# Patient Record
Sex: Male | Born: 1940 | Race: White | Hispanic: No | Marital: Single
Health system: Southern US, Community
[De-identification: ages and names within clinical notes are randomized; demographics above are authoritative.]

## PROBLEM LIST (undated history)

## (undated) DIAGNOSIS — I2510 Atherosclerotic heart disease of native coronary artery without angina pectoris: Secondary | ICD-10-CM

## (undated) DIAGNOSIS — E7800 Pure hypercholesterolemia, unspecified: Secondary | ICD-10-CM

## (undated) DIAGNOSIS — E785 Hyperlipidemia, unspecified: Secondary | ICD-10-CM

## (undated) DIAGNOSIS — I10 Essential (primary) hypertension: Secondary | ICD-10-CM

## (undated) DIAGNOSIS — N320 Bladder-neck obstruction: Secondary | ICD-10-CM

## (undated) DIAGNOSIS — N429 Disorder of prostate, unspecified: Secondary | ICD-10-CM

## (undated) DIAGNOSIS — N200 Calculus of kidney: Secondary | ICD-10-CM

## (undated) DIAGNOSIS — Z87898 Personal history of other specified conditions: Secondary | ICD-10-CM

## (undated) HISTORY — PX: OTHER SURGICAL HISTORY: SHX169

## (undated) HISTORY — DX: Pure hypercholesterolemia, unspecified: E78.00

## (undated) HISTORY — DX: Essential (primary) hypertension: I10

## (undated) HISTORY — DX: Calculus of kidney: N20.0

## (undated) HISTORY — DX: Hyperlipidemia, unspecified: E78.5

## (undated) HISTORY — DX: Atherosclerotic heart disease of native coronary artery without angina pectoris: I25.10

## (undated) HISTORY — DX: Personal history of other specified conditions: Z87.898

## (undated) HISTORY — DX: Bladder-neck obstruction: N32.0

---

## 1983-10-26 HISTORY — PX: HERNIA REPAIR: SHX51

## 1999-12-08 ENCOUNTER — Encounter: Payer: Self-pay | Admitting: Thoracic Surgery (Cardiothoracic Vascular Surgery)

## 1999-12-09 ENCOUNTER — Encounter: Payer: Self-pay | Admitting: Thoracic Surgery (Cardiothoracic Vascular Surgery)

## 1999-12-09 ENCOUNTER — Inpatient Hospital Stay (HOSPITAL_COMMUNITY)
Admission: RE | Admit: 1999-12-09 | Discharge: 1999-12-14 | Payer: Self-pay | Admitting: Thoracic Surgery (Cardiothoracic Vascular Surgery)

## 1999-12-10 ENCOUNTER — Encounter: Payer: Self-pay | Admitting: Thoracic Surgery (Cardiothoracic Vascular Surgery)

## 1999-12-11 ENCOUNTER — Encounter: Payer: Self-pay | Admitting: Thoracic Surgery (Cardiothoracic Vascular Surgery)

## 2000-09-24 HISTORY — PX: CORONARY ARTERY BYPASS GRAFT: SHX141

## 2005-10-12 ENCOUNTER — Emergency Department: Payer: Self-pay | Admitting: Emergency Medicine

## 2006-03-21 ENCOUNTER — Emergency Department: Payer: Self-pay | Admitting: Emergency Medicine

## 2006-04-03 ENCOUNTER — Other Ambulatory Visit: Payer: Self-pay

## 2006-04-03 ENCOUNTER — Emergency Department: Payer: Self-pay | Admitting: Emergency Medicine

## 2006-04-28 ENCOUNTER — Ambulatory Visit: Payer: Self-pay

## 2006-04-29 ENCOUNTER — Ambulatory Visit: Payer: Self-pay

## 2006-05-17 ENCOUNTER — Ambulatory Visit: Payer: Self-pay | Admitting: Urology

## 2007-05-18 ENCOUNTER — Ambulatory Visit: Payer: Self-pay | Admitting: Urology

## 2009-03-17 ENCOUNTER — Emergency Department (HOSPITAL_COMMUNITY): Admission: EM | Admit: 2009-03-17 | Discharge: 2009-03-17 | Payer: Self-pay | Admitting: Emergency Medicine

## 2009-03-21 ENCOUNTER — Emergency Department (HOSPITAL_COMMUNITY): Admission: EM | Admit: 2009-03-21 | Discharge: 2009-03-21 | Payer: Self-pay | Admitting: Family Medicine

## 2010-01-29 ENCOUNTER — Emergency Department: Payer: Self-pay | Admitting: Emergency Medicine

## 2010-01-30 ENCOUNTER — Emergency Department: Payer: Self-pay | Admitting: Emergency Medicine

## 2010-02-03 ENCOUNTER — Inpatient Hospital Stay: Payer: Self-pay | Admitting: Urology

## 2010-08-04 ENCOUNTER — Inpatient Hospital Stay: Payer: Self-pay | Admitting: Internal Medicine

## 2011-02-02 LAB — RAPID URINE DRUG SCREEN, HOSP PERFORMED
Amphetamines: NOT DETECTED
Barbiturates: NOT DETECTED
Benzodiazepines: NOT DETECTED
Cocaine: NOT DETECTED
Opiates: NOT DETECTED
Tetrahydrocannabinol: NOT DETECTED

## 2011-02-02 LAB — POCT CARDIAC MARKERS
CKMB, poc: 2.7 ng/mL (ref 1.0–8.0)
Myoglobin, poc: 151 ng/mL (ref 12–200)

## 2011-03-12 NOTE — Discharge Summary (Signed)
[place]. [facility]  Patient:    Jeff Reynolds, Jeff Reynolds                        MRN: [id number] Adm. Date:  [id number] Disc. Date: [id number] Attending:  Tressie Stalker Dictator:   Lissa Merlin, P.A.                           Discharge Summary  DATE OF BIRTH:  July 03, 1941.  During morning rounds today, Jeff Reynolds was evaluated for anticipated discharge home.  He looked very well.  He said that he felt well except for some hesitancy and frequency of urination this morning.  He does have a history of BPH.  His vital signs are stable:  blood pressure 128/70, pulse 83, sinus rhythm on telemetry.  Respiratory rate 18, 96% on room air, temperature 98.1.  This physical exam was  satisfactory.  His chest was clear.  His abdomen was soft, nontender, with positive bowel sounds.  His heart was regular rate and rhythm, S1, S2.  His wounds were healing well.  He had 1+ edema in his lower extremities.  His suprapubic area was nontender and nondistended.  At this time it was felt that he should be reevaluated later in the morning for possible discharge and monitor his urinary output and check a UA and culture.  Later in the day, his lab results came back for his urine, which indicated a mild UTI.  Patient relates recent history of UTI before hospitalization for which he was treated with Cipro.  He also relates history of taking Flomax for his BPH, but did not indicate this on his admission medications. A post void residual urine in-and-out cath was done which revealed 275 cc of clear yellow urine.  After arranging follow-up with Dr. Achilles Dunk, his urologist in [place] for 9:45 a.m. in the morning, Jeff Reynolds was deemed suitable for discharge home. He said he felt much better after the in-and-out cath.  He understands to follow up with Dr. Achilles Dunk in the morning.  He was discharged home on Cipro 500 mg p.o. b.i.d. and Flomax 0.4 mg p.o. q.d. in addition to the medications listed  on the earlier report. DD:  12/14/99 TD:  12/14/99 Job: [id number]

## 2011-03-12 NOTE — Op Note (Signed)
[place]. [facility])  Patient:    Jeff Reynolds, Jeff Reynolds                        MRN: [id number] Proc. Date: 12/09/99 Adm. Date:  [id number] Attending:  Tressie Stalker CC:         Arnoldo Hooker, M.D., [place], [place]             Jeff Mansfield, M.D., [place], [place]             CVTS Office                           Operative Report  PREOPERATIVE DIAGNOSIS:  Severe three-vessel coronary artery disease with positive stress test and mild left ventricular dysfunction.  POSTOPERATIVE DIAGNOSIS:  Severe three-vessel coronary artery disease with positive stress test and mild left ventricular dysfunction.  OPERATION:  Mediastinotomy for coronary artery bypass grafting x 4 (left internal mammary artery to distal left anterior descending coronary artery, right internal mammary artery to distal right coronary artery, saphenous vein graft to first diagonal branch, saphenous vein graft to ramus intermedius branch.  SURGEON:  Salvatore Decent. Cornelius Moras, M.D.  ASSISTANT:   Maple Mirza, P.A.  ANESTHESIA:   General.  BRIEF CLINICAL NOTE:  The patient is a 70 year old white male from [place], [place] followed by Dr. Dale Reynolds and referred by Dr. Arnoldo Hooker for  management of coronary artery disease.  Jeff Reynolds has history of hypertension, hypercholesterolemia.  He recently developed a near syncopal episode following change of medications for treatment of hypertension.  This prompted a stress test to be performed, and a stress echocardiogram was performed by Dr. Gwen Pounds. This revealed changes consistent with coronary ischemia.  Elective cardiac catheterization was performed by Dr. Gwen Pounds which revealed severe three-vessel coronary artery disease.  There was mild mitral regurgitation and mild to moderate left ventricular dysfunction with overall ejection fraction estimated at 45%. he patient was referred for surgical  intervention.  OPERATIVE CONSENT:  The patient and his daughter were counselled at length regarding the indications and potential benefits of coronary artery bypass grafting.  They understand the associated risks of surgery including but not limited to risk of death, stroke, myocardial infarction, bleeding requiring blood transfusion, arrhythmia, infection, and recurrent coronary artery disease. They also understand rationale for intraoperative transesophageal echo to evaluate his mild mitral regurgitation with possible need for mitral valve repair or replacement if this is found to be severe or significantly contributing.  They accepts all associated risks of surgery and desire to proceed as described.  DESCRIPTION OF PROCEDURE:  The patient was brought to the operating room on the  above-mentioned date, and invasive hemodynamic monitoring was established by the anesthesia service under the care and direction of Dr. Hart Robinsons.  The patient was placed in the supine position on the operating table.  Following induction of general endotracheal anesthesia, transesophageal echocardiogram is  performed by Dr. Gelene Mink.  This demonstrates mild left ventricular dysfunction with mild anterior and anterolateral hypokinesis.  Overall ejection fraction appears to be at least 45 to 50%.  There is mild left ventricular hypertrophy.  There is tract to mild mitral regurgitation.  This is associated with mild prolapse of the anterior leaflet of the mitral valve.  The mitral regurgitation is at most mild in severity.  The left atrium appears to be relatively normal in size. There is trace to mild aortic  insufficiency.  No other abnormalities are identified.  The patients chest, abdomen, both groins, and both lower extremities were prepped and draped in a sterile manner.  A median sternotomy incision was performed and the left internal mammary artery is dissected from the chest wall  and prepared for bypass grafting.  The left internal mammary artery is noted to be good quality conduit for bypass grafting.  Subsequently, the right internal mammary artery is dissected from the chest wall in a similar fashion and also prepared for bypass  grafting.  The right internal mammary artery is noted to be good quality conduit for bypass grafting.   Simultaneously, saphenous vein is obtained from the patients right lower leg through a series of longitudinal incisions.  The saphenous vein is noted to be good quality conduit for bypass grafting.  The patient is heparinized systemically.  The pericardium is opened.  The ascending aorta is inspected and is notably free of any palpable plaques or calcifications.  The ascending aorta and the right atrial appendage are cannulated for cardiopulmonary bypass.  Adequate heparinization is verified.  Cardiopulmonary bypass is begun, and the surface of the heart is inspected. There is a mild left ventricular hypertrophy.  Distal sites are selected for coronary  bypass grafting.  Of note, the right coronary artery appears to be chronically occluded, but the distal right coronary artery just above the bifurcation appears to be soft and acceptable target for bypass grafting.  The distal right coronary artery gives off a large posterior descending coronary artery, several tiny posterolateral branches, and one medium sized posterolateral branch.  The giant  ramus intermedius branch noted on cardiac catheterization appears to be completely intramyocardial and cannot be seen from the surface.  The first circumflex marginal branch is identified and is notably very small caliber and felt not to be acceptable size for bypass grafting.  Portions of saphenous vein and both internal mammary arteries are trimmed to appropriate lengths.  A temperature probe is placed in the left ventricular septum, and a styrofoam pad is placed to protect  the left phrenic nerve from thermal injury.  A cardioplegia catheter is placed in the ascending aorta.  The patient is cooled to 30 degrees systemic temperature.  The aortic crossclamp is  applied, and cardioplegia is delivered in antegrade fashion through the aortic root.  Additional doses of cardioplegia are administered both through the aortic root and down subsequently placed vein grafts to maintain septal temperature below 15 degrees centigrade throughout the crossclamp portion of the operation. Topical cold slush is applied for topical hypothermia.  The following distal coronary anastomoses are performed:  1. The ramus intermedius branch is grafted with a saphenous vein graft in    end-to-side fashion using running 7-0 Prolene suture.  This coronary is    intramyocardial but is easily identified through dissection through the muscular    wall of the lateral portion of the left ventricle.  This is a huge coronary    artery that measures in excess of 2.5 mm in diameter.  There is 80% proximal    stenosis.  This coronary is of good quality at the site of distal bypass. 2. The second diagonal branch is grafted with a saphenous vein graft in an    end-to-side fashion using running 7-0 Prolene suture.  This coronary measures    1.5 mm in diameter at the site of distal bypass and has 80% proximal stenosis.    It is of fair to good quality. 3. The  distal right coronary artery is grafted with the right internal mammary    artery in end-to-side fashion using running 7-0 Prolene suture.  This coronary    measures 1.7 mm at the site of distal bypass and is of fair to good quality. A    1.5 mm probe will pass both down the posterior descending coronary artery as    well as the distal right coronary artery beyond the bifurcation.  This coronary    is chronically occluded proximally. 4. The distal left anterior descending coronary artery is grafted with the left  internal mammary  artery using running 8-0 Prolene suture.  This coronary    measures 2.2 mm in diameter and has a 70% proximal stenosis.  This coronary s    of good quality at the site of distal bypass.  Both proximal saphenous vein  anastomoses are performed to the ascending aorta prior to removal of the aortic  cross clamp. The patient is placed in Trendelenburg position, and the aortic root is deaired.  The septal temperature is noted to rise rapidly and dramatically upon reperfusion of the left and right internal mammary arteries.  The aortic crossclamp is removed after a total crossclamp time of 79  minutes.  The patient is rewarmed to greater than 37 degrees C temperature. The heart is defibrillated on two occasions and resumes normal sinus rhythm spontaneously. ll proximal and distal anastomoses are inspected for hemostasis and appropriate graft orientation.  Epicardial pacing wires are fixed to the right ventricular outflow tract and to the right atrial appendage.  The patient is weaned from cardiopulmonary bypass without difficulty.  The patients rhythm at separation from bypass is normal sinus rhythm.  No inotropic support is required.  Total cardiopulmonary bypass time for the operation is 116 minutes.  Followup transesophageal echocardiogram performed following separation from bypass demonstrates some improvement in overall left ventricular performance with relatively normal-appearing wall motion.  There is no change in the trace to mild amount of mitral regurgitation.  No other abnormalities are identified. Initial cardiac output following separation from bypass is in excess of 8 liters per minute.  The venous and arterial cannulae are removed uneventfully.  Protamine is administered to reverse the anticoagulation.  The mediastinum and both left and  right pleural spaces are irrigated with saline solution containing vancomycin. Meticulous surgical hemostasis is  ascertained.  Both the left and right pleural spaces and the anterior mediastinum are drained  using four chest tubes placed through separate stab incisions inferiorly.  The median sternotomy is closed in routine fashion.  The right lower extremity incisions are closed in multiple layers in routine fashion.  All skin incisions are closed with subcuticular skin closures.  The patient tolerated the procedure well and is transported to the surgical intensive care unit in stable condition.   There are no intraoperative complications.  All sponge, instrument, and needle counts are verified correct t the completion of the operation.  No autologous blood products were administered. DD:  12/09/99 TD:  12/09/99 Job: [id number]

## 2011-08-09 ENCOUNTER — Ambulatory Visit: Payer: Self-pay | Admitting: Internal Medicine

## 2011-09-09 ENCOUNTER — Ambulatory Visit: Payer: Self-pay | Admitting: Urology

## 2012-03-01 ENCOUNTER — Ambulatory Visit: Payer: Self-pay | Admitting: Internal Medicine

## 2012-03-06 LAB — CBC AND DIFFERENTIAL
HCT: 47 % (ref 41–53)
Hemoglobin: 15.7 g/dL (ref 13.5–17.5)
Platelets: 155 10*3/uL (ref 150–399)

## 2012-03-06 LAB — LIPID PANEL
Cholesterol: 165 mg/dL (ref 0–200)
LDL Cholesterol: 98 mg/dL
Triglycerides: 89 mg/dL (ref 40–160)

## 2012-03-06 LAB — HEPATIC FUNCTION PANEL
ALT: 17 U/L (ref 10–40)
Alkaline Phosphatase: 51 U/L (ref 25–125)

## 2012-03-06 LAB — BASIC METABOLIC PANEL
BUN: 28 mg/dL — AB (ref 4–21)
Creatinine: 0.9 mg/dL (ref <=1.3)

## 2012-09-12 ENCOUNTER — Ambulatory Visit: Payer: Self-pay | Admitting: Surgery

## 2013-01-04 ENCOUNTER — Telehealth: Payer: Self-pay | Admitting: *Deleted

## 2013-01-04 ENCOUNTER — Encounter: Payer: Self-pay | Admitting: *Deleted

## 2013-01-04 MED ORDER — LISINOPRIL 10 MG PO TABS: 10.0000 mg | ORAL_TABLET | Freq: Every day | ORAL | Status: DC

## 2013-01-04 NOTE — Telephone Encounter (Signed)
Patient called and said that he was out of lisinopril. Sent in to pharmacy,

## 2013-01-09 ENCOUNTER — Ambulatory Visit (INDEPENDENT_AMBULATORY_CARE_PROVIDER_SITE_OTHER): Payer: Medicare Other | Admitting: Internal Medicine

## 2013-01-09 ENCOUNTER — Encounter: Payer: Self-pay | Admitting: Internal Medicine

## 2013-01-09 VITALS — BP 138/70 | HR 58 | Temp 97.5°F | Ht 68.17 in | Wt 154.8 lb

## 2013-01-09 DIAGNOSIS — I10 Essential (primary) hypertension: Secondary | ICD-10-CM | POA: Insufficient documentation

## 2013-01-09 DIAGNOSIS — I2510 Atherosclerotic heart disease of native coronary artery without angina pectoris: Secondary | ICD-10-CM

## 2013-01-14 ENCOUNTER — Encounter: Payer: Self-pay | Admitting: Internal Medicine

## 2013-01-14 ENCOUNTER — Other Ambulatory Visit: Payer: Self-pay | Admitting: Internal Medicine

## 2013-01-14 DIAGNOSIS — E7800 Pure hypercholesterolemia, unspecified: Secondary | ICD-10-CM

## 2013-01-14 DIAGNOSIS — I25810 Atherosclerosis of coronary artery bypass graft(s) without angina pectoris: Secondary | ICD-10-CM

## 2013-01-14 DIAGNOSIS — N320 Bladder-neck obstruction: Secondary | ICD-10-CM | POA: Insufficient documentation

## 2013-01-14 NOTE — Progress Notes (Signed)
Order placed for follow up labs 

## 2013-01-14 NOTE — Progress Notes (Signed)
  Subjective:    Patient ID: Jeff Reynolds, male    DOB: January 07, 1941, 72 y.o.   MRN: [id number]  HPI 72 year old male with past history of hypertension, hypercholesterolemia and CAD s/p CABG who comes in today for a scheduled follow up.  He is also here to transfer his care to [facility].  Still seeing Dr Achilles Dunk.  Urinating well.  Is active.  Reports no chest pain or tightness with increased activity or exertion.  Breathing doing well.  No sob.  States his blood pressure has been a little elevated.  He has been out of the lisinopril.  No acid reflux.  No abdominal pain.  Overdue colonoscopy.  Has declined GI w/up in the past.  Agreeable today.  Bowels stable.    Past Medical History  Diagnosis Date  . Hyperlipidemia   . Hypertension   . Hypercholesterolemia   . 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 Bladder outlet obstruction     followed by Dr Achilles Dunk  . Nephrolithiasis   . History of elevated PSA     followed by Dr Achilles Dunk    Current Outpatient Prescriptions on File Prior to Visit  Medication Sig Dispense Refill  . lisinopril (PRINIVIL,ZESTRIL) 10 MG tablet Take 1 tablet (10 mg total) by mouth daily.  30 tablet  0  . metoprolol (LOPRESSOR) 50 MG tablet Take 50 mg by mouth 2 (two) times daily. 1/2 tablet daily      . rosuvastatin (CRESTOR) 20 MG tablet Take 20 mg by mouth at bedtime.      Marland Kitchen spironolactone (ALDACTONE) 25 MG tablet Take 25 mg by mouth daily.       No current facility-administered medications on file prior to visit.   Review of Systems Patient denies any headache, lightheadedness or dizziness. No sinus or allergy symptoms.  No chest pain, tightness or palpitations.  No increased shortness of breath, cough or congestion.  No acid reflux.  No nausea or vomiting.  No abdominal pain or cramping.  No bowel change, such as diarrhea, constipation, BRBPR or melana.  No urine change.        Objective:   Physical Exam Filed Vitals:   01/09/13 1045  BP: 138/70  Pulse: 58   Temp: 97.5 F (36.4 C)   Blood pressure recheck:  25/65  72 year old male in no acute distress.  HEENT:  Nares - clear.  Oropharynx - without lesions. NECK:  Supple.  Nontender.  No audible carotid bruit.  HEART:  Appears to be regular.   LUNGS:  No crackles or wheezing audible.  Respirations even and unlabored.   RADIAL PULSE:  Equal bilaterally.  ABDOMEN:  Soft.  Nontender.  Bowel sounds present and normal.  No audible abdominal bruit.    EXTREMITIES:  No increased edema present.         Assessment & Plan:  PULMONARY.  No sob.  Breathing stable.  HEALTH MAINTENANCE.  Physical 03/01/12.  Prostate and PSAs followed through Dr Achilles Dunk.  Agreeable to GI referral today.  Overdue colonoscopy.

## 2013-01-14 NOTE — Assessment & Plan Note (Signed)
Blood pressure elevated.  Has been off the lisinopril.  Just started back on now.  Follow.  Get him back in soon to reassess his blood pressure. If persistent elevation, will need to increase lisinopril.

## 2013-01-14 NOTE — Assessment & Plan Note (Signed)
Off crestor.  Will restart.  Samples of crestor 10mg  given.  Follow.

## 2013-01-14 NOTE — Assessment & Plan Note (Signed)
S/p CABG.  Followed by Dr Kowalski.  Currently asymptomatic.  Continue aggressive risk factor modification.   

## 2013-01-14 NOTE — Assessment & Plan Note (Signed)
Followed by Dr Cope.  Urinating well.  Follow.   

## 2013-02-01 ENCOUNTER — Other Ambulatory Visit (INDEPENDENT_AMBULATORY_CARE_PROVIDER_SITE_OTHER): Payer: Medicare Other

## 2013-02-01 DIAGNOSIS — I25810 Atherosclerosis of coronary artery bypass graft(s) without angina pectoris: Secondary | ICD-10-CM

## 2013-02-01 DIAGNOSIS — E7800 Pure hypercholesterolemia, unspecified: Secondary | ICD-10-CM

## 2013-02-01 LAB — BASIC METABOLIC PANEL
CO2: 29 mEq/L (ref 19–32)
Chloride: 102 mEq/L (ref 96–112)
GFR: 70.89 mL/min (ref >60.00)
Glucose, Bld: 114 mg/dL — ABNORMAL HIGH (ref 70–99)
Potassium: 3.7 mEq/L (ref 3.5–5.1)
Sodium: 139 mEq/L (ref 135–145)

## 2013-02-01 LAB — CBC WITH DIFFERENTIAL/PLATELET
Basophils Relative: 0.7 % (ref 0.0–3.0)
Eosinophils Relative: 1.3 % (ref 0.0–5.0)
Lymphocytes Relative: 26.7 % (ref 12.0–46.0)
Monocytes Absolute: 0.4 10*3/uL (ref 0.1–1.0)
Monocytes Relative: 8.3 % (ref 3.0–12.0)
Neutrophils Relative %: 63 % (ref 43.0–77.0)
Platelets: 156 10*3/uL (ref 150.0–400.0)
RBC: 5.09 Mil/uL (ref 4.22–5.81)
WBC: 5.4 10*3/uL (ref 4.5–10.5)

## 2013-02-01 LAB — LIPID PANEL
HDL: 53 mg/dL (ref >39.00)
Total CHOL/HDL Ratio: 3
VLDL: 13 mg/dL (ref 0.0–40.0)

## 2013-02-01 LAB — HEPATIC FUNCTION PANEL
ALT: 17 U/L (ref 0–53)
AST: 21 U/L (ref 0–37)
Albumin: 3.7 g/dL (ref 3.5–5.2)
Alkaline Phosphatase: 49 U/L (ref 39–117)

## 2013-02-06 ENCOUNTER — Encounter: Payer: Self-pay | Admitting: Internal Medicine

## 2013-02-06 ENCOUNTER — Ambulatory Visit (INDEPENDENT_AMBULATORY_CARE_PROVIDER_SITE_OTHER): Payer: Medicare Other | Admitting: Internal Medicine

## 2013-02-06 VITALS — BP 140/80 | HR 62 | Temp 98.0°F | Resp 18 | Wt 150.2 lb

## 2013-02-06 DIAGNOSIS — E7800 Pure hypercholesterolemia, unspecified: Secondary | ICD-10-CM

## 2013-02-06 DIAGNOSIS — I2510 Atherosclerotic heart disease of native coronary artery without angina pectoris: Secondary | ICD-10-CM

## 2013-02-06 DIAGNOSIS — R7309 Other abnormal glucose: Secondary | ICD-10-CM

## 2013-02-06 DIAGNOSIS — R739 Hyperglycemia, unspecified: Secondary | ICD-10-CM

## 2013-02-06 DIAGNOSIS — N320 Bladder-neck obstruction: Secondary | ICD-10-CM

## 2013-02-06 DIAGNOSIS — I10 Essential (primary) hypertension: Secondary | ICD-10-CM

## 2013-02-06 MED ORDER — LISINOPRIL 20 MG PO TABS: 20.0000 mg | ORAL_TABLET | Freq: Every day | ORAL | Status: DC

## 2013-02-11 ENCOUNTER — Encounter: Payer: Self-pay | Admitting: Internal Medicine

## 2013-02-11 DIAGNOSIS — R739 Hyperglycemia, unspecified: Secondary | ICD-10-CM | POA: Insufficient documentation

## 2013-02-11 NOTE — Assessment & Plan Note (Signed)
Blood pressure a little elevated.  Will increase lisinopril to 20mg  q day.  Follow.  Get him back in soon to reassess.

## 2013-02-11 NOTE — Assessment & Plan Note (Signed)
S/p CABG.  Followed by Dr Kowalski.  Currently asymptomatic.  Continue aggressive risk factor modification.   

## 2013-02-11 NOTE — Assessment & Plan Note (Signed)
Followed by Dr Cope.  Urinating well.  Follow.   

## 2013-02-11 NOTE — Assessment & Plan Note (Signed)
On crestor.  Lipid profile 02/01/13 revealed total cholesterol 144, triglycerides 65, HDL 53 and LDL 78.  Continue current med regimen.  Follow.  Liver panel wnl.

## 2013-02-11 NOTE — Assessment & Plan Note (Signed)
Recheck fasting glucose and a1c.     

## 2013-02-11 NOTE — Progress Notes (Signed)
  Subjective:    Patient ID: Jeff Reynolds, male    DOB: November 15, 1940, 72 y.o.   MRN: [id number]  HPI 72 year old male with past history of hypertension, hypercholesterolemia and CAD s/p CABG who comes in today for a scheduled follow up.  Still seeing Dr Achilles Dunk.  Urinating well.  Is active.  Reports no chest pain or tightness with increased activity or exertion.  Breathing doing well.  No sob.  States his blood pressure has been a little elevated.  Blood pressure averaging 140/76-80.  No acid reflux.  No abdominal pain.  Overdue colonoscopy.  Has declined GI w/up in the past.  Agreeable today.  Bowels stable.    Past Medical History  Diagnosis Date  . Hyperlipidemia   . Hypertension   . Hypercholesterolemia   . 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 Bladder outlet obstruction     followed by Dr Achilles Dunk  . Nephrolithiasis   . History of elevated PSA     followed by Dr Achilles Dunk    Current Outpatient Prescriptions on File Prior to Visit  Medication Sig Dispense Refill  . metoprolol (LOPRESSOR) 50 MG tablet Take 50 mg by mouth 2 (two) times daily. 1/2 tablet daily      . rosuvastatin (CRESTOR) 20 MG tablet Take 20 mg by mouth at bedtime.      Marland Kitchen spironolactone (ALDACTONE) 25 MG tablet Take 25 mg by mouth daily.       No current facility-administered medications on file prior to visit.    Review of Systems Patient denies any headache, lightheadedness or dizziness. No sinus or allergy symptoms.  No chest pain, tightness or palpitations.  No increased shortness of breath, cough or congestion.  No acid reflux.  No nausea or vomiting.  No abdominal pain or cramping.  No bowel change, such as diarrhea, constipation, BRBPR or melana.  No urine change.        Objective:   Physical Exam  Filed Vitals:   02/06/13 0857  BP: 140/80  Pulse: 62  Temp: 98 F (36.7 C)  Resp: 18   Blood pressure recheck:  138-140/78, pulse 32  72 year old male in no acute distress.  HEENT:  Nares - clear.   Oropharynx - without lesions. NECK:  Supple.  Nontender.  No audible carotid bruit.  HEART:  Appears to be regular.   LUNGS:  No crackles or wheezing audible.  Respirations even and unlabored.   RADIAL PULSE:  Equal bilaterally.  ABDOMEN:  Soft.  Nontender.  Bowel sounds present and normal.  No audible abdominal bruit.    EXTREMITIES:  No increased edema present.         Assessment & Plan:  PULMONARY.  No sob.  Breathing stable.  HEALTH MAINTENANCE.  Physical 03/01/12.  Prostate and PSAs followed through Dr Achilles Dunk.  Agreeable to GI referral.  Overdue colonoscopy. Refer to GI.

## 2013-02-16 ENCOUNTER — Encounter: Payer: Self-pay | Admitting: *Deleted

## 2013-02-16 ENCOUNTER — Other Ambulatory Visit (INDEPENDENT_AMBULATORY_CARE_PROVIDER_SITE_OTHER): Payer: Medicare Other

## 2013-02-16 DIAGNOSIS — R7309 Other abnormal glucose: Secondary | ICD-10-CM

## 2013-02-16 DIAGNOSIS — R739 Hyperglycemia, unspecified: Secondary | ICD-10-CM

## 2013-03-21 ENCOUNTER — Ambulatory Visit: Payer: Medicare Other | Admitting: Internal Medicine

## 2013-03-26 ENCOUNTER — Ambulatory Visit: Payer: Self-pay | Admitting: Surgery

## 2013-04-30 ENCOUNTER — Other Ambulatory Visit: Payer: Self-pay | Admitting: Internal Medicine

## 2013-09-10 ENCOUNTER — Telehealth: Payer: Self-pay | Admitting: Internal Medicine

## 2013-09-10 NOTE — Telephone Encounter (Signed)
I spoke with pharmacy & pt has 3 refill remaining. Could not notify patient because their voicemail is full.

## 2013-09-10 NOTE — Telephone Encounter (Signed)
Pt is needing refill on Lysinopril pt uses [facility] in [facility]. Pt is completely out of medication.

## 2013-09-17 IMAGING — US US ABDOMEN LIMITED SLG ORGAN/ASCITES
1 series · 17 of 25 positions shown · non-contrast
Comparison: none

REASON FOR EXAM: hyperbilirubinemia   liver
COMMENTS:

[Series 1: us abdomen limited slg organ/ascites · 17 of 60 slices shown]
[im 1/60]
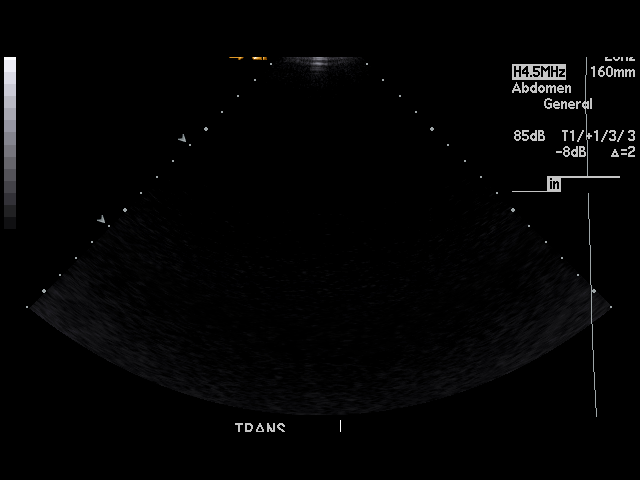
[im 5/60]
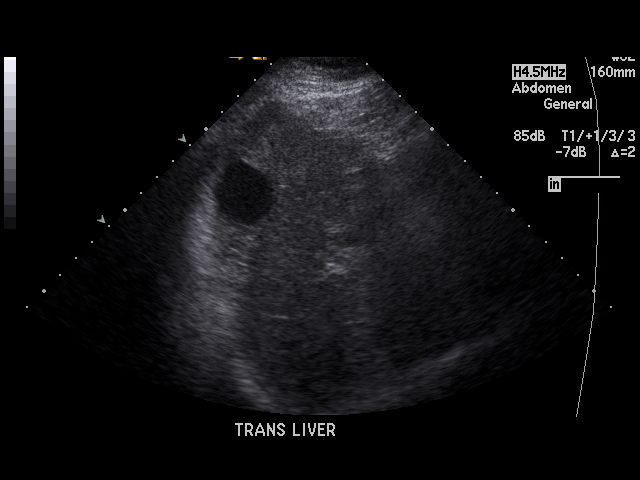
[im 8/60]
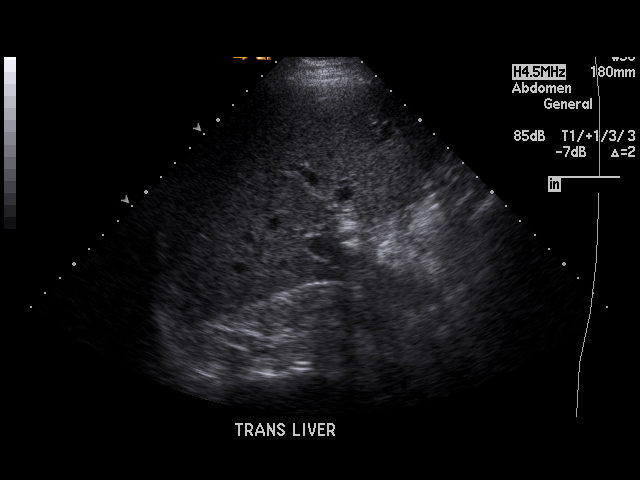
[im 13/60]
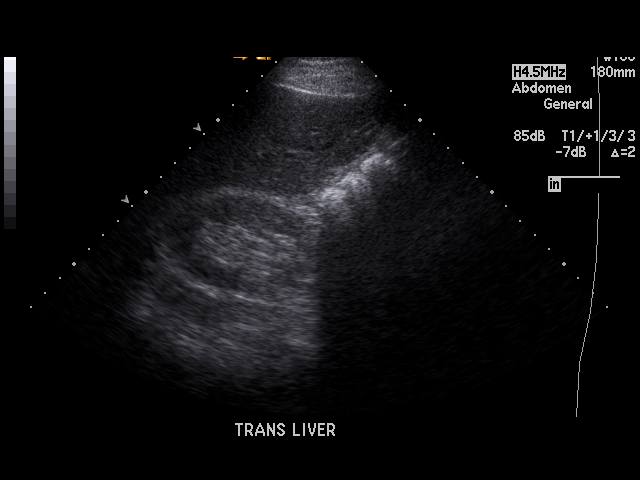
[im 15/60]
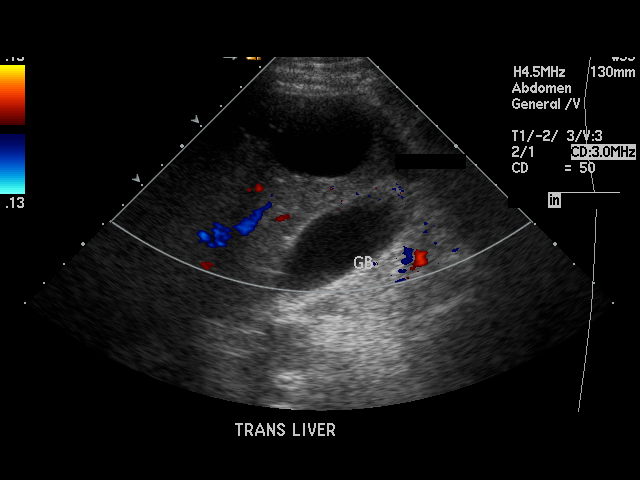
[im 20/60]
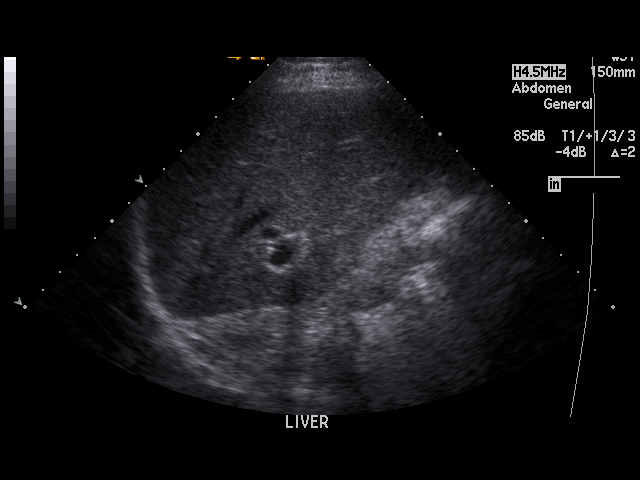
[im 23/60]
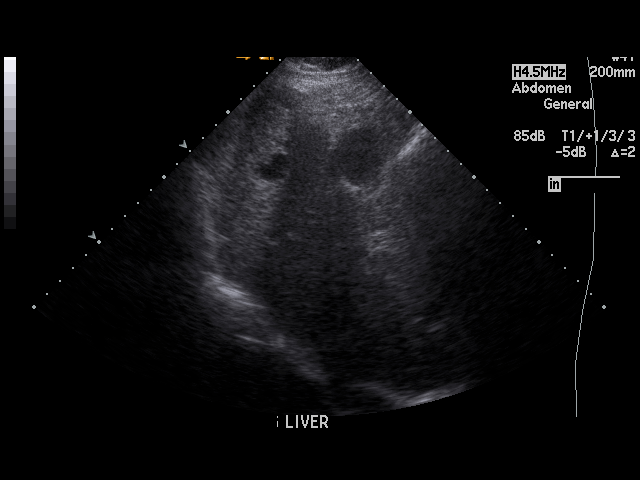
[im 28/60]
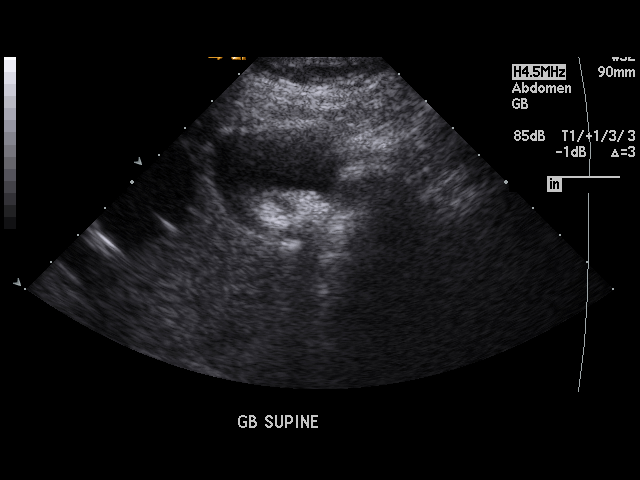
[im 30/60]
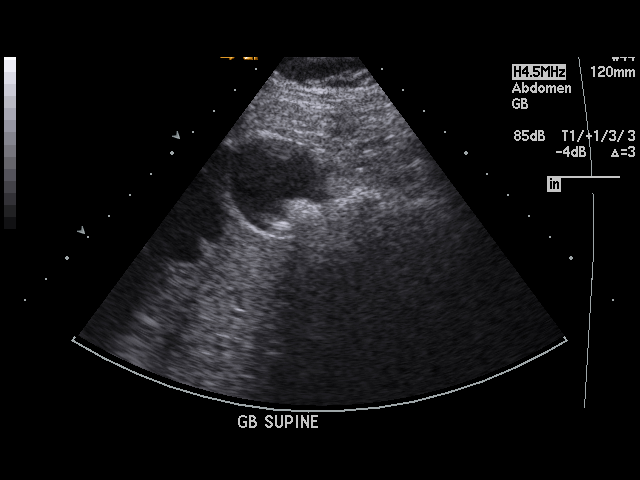
[im 32/60]
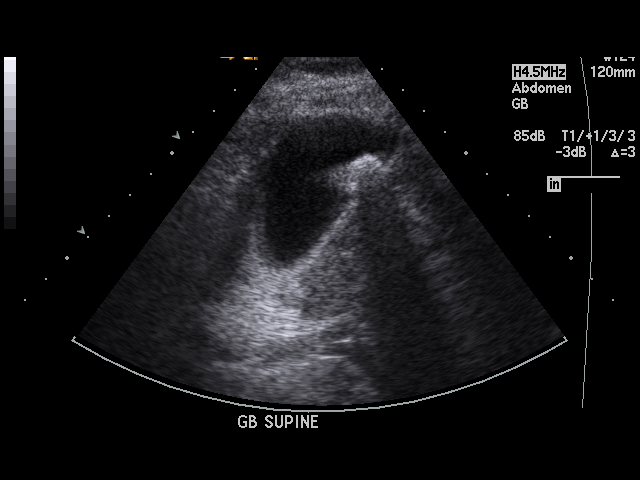
[im 37/60]
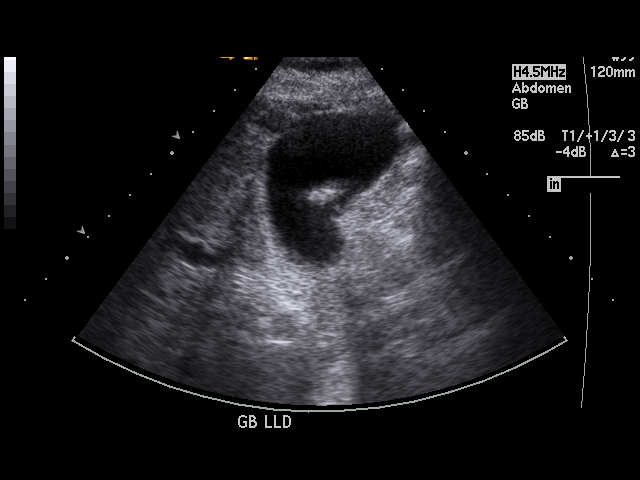
[im 40/60]
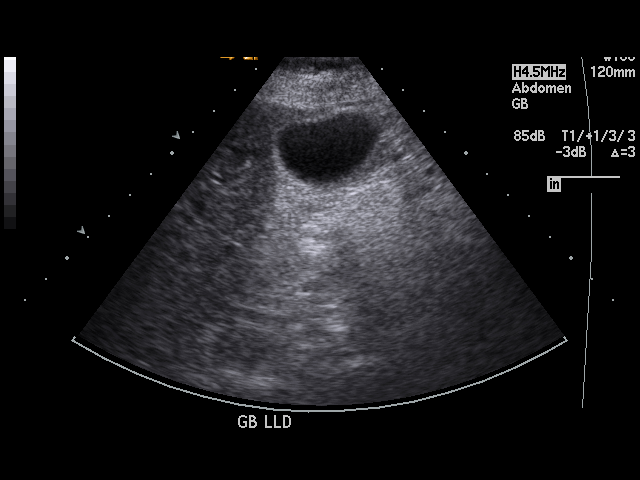
[im 45/60]
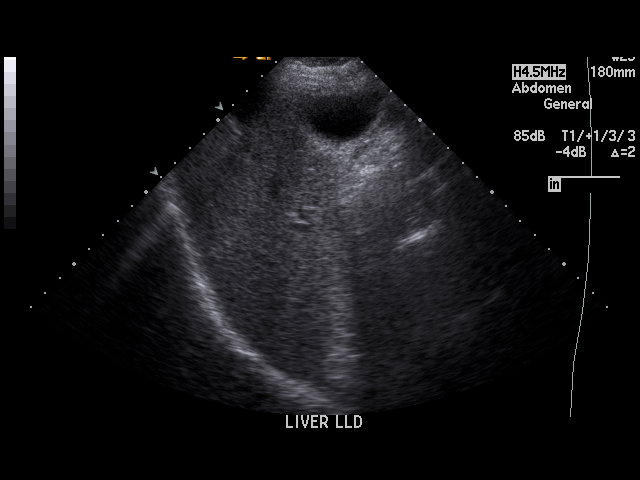
[im 47/60]
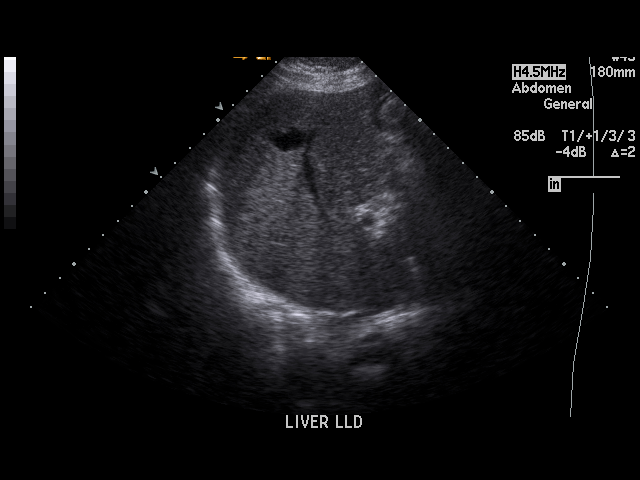
[im 52/60]
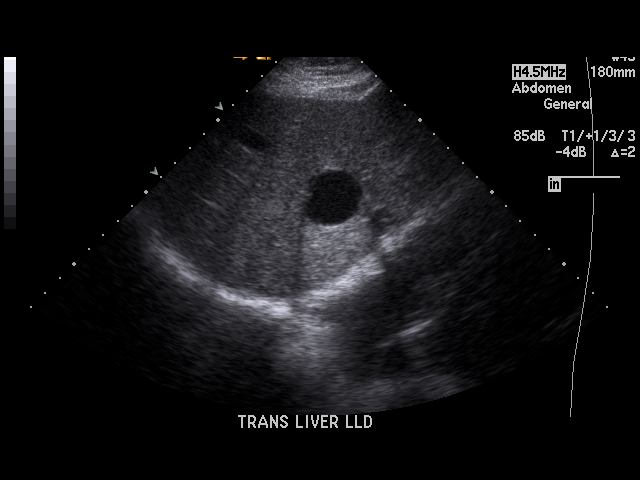
[im 55/60]
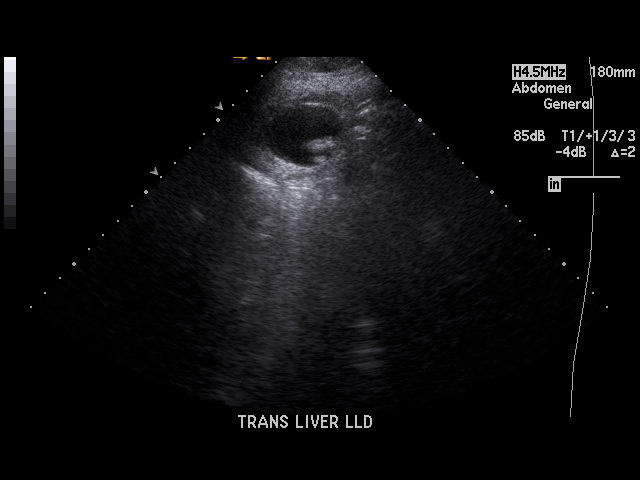
[im 60/60]
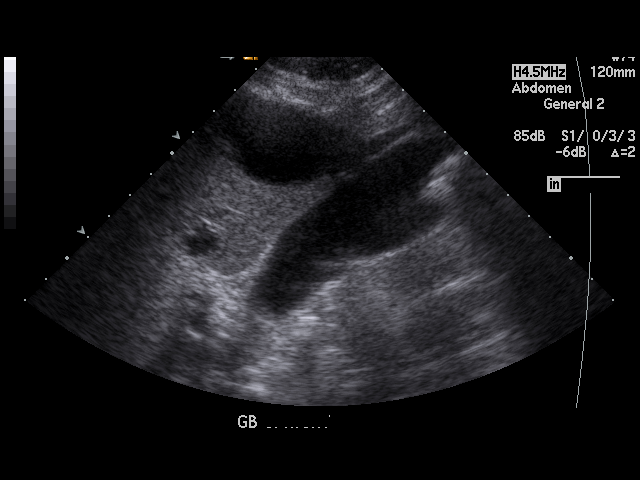

[17 of 25 positions shown; findings below may reference images not displayed]

PROCEDURE:     VISHNU - VISHNU ABDOMEN LTD 1 ORGAN OR QUAD  - August 09, 2011  [DATE]

RESULT:     Limited abdominal ultrasound examination was performed as
requested. Multiple hepatic cysts are seen. The largest measures 4.1 cm at
maximum diameter. No dilated intrahepatic ducts are noted. There are
multiple echo densities in the gallbladder compatible with gallstones. There
is no thickening of the gallbladder wall which measures 2.6 mm in thickness.
The common bile duct measures 4.1 mm in diameter which is within normal
limits. No pericholecystic fluid is seen.
IMPRESSION: 1. Cholelithiasis with no associated thickening of the gallbladder wall.
2. The common bile duct is normal in size.
3. There are multiple hepatic cysts.
4. The head of the pancreas is not visualized adequately for evaluation on
this exam.

## 2013-11-29 ENCOUNTER — Ambulatory Visit: Payer: Medicare Other | Admitting: Internal Medicine

## 2013-12-23 ENCOUNTER — Other Ambulatory Visit: Payer: Self-pay | Admitting: Internal Medicine

## 2014-02-04 ENCOUNTER — Ambulatory Visit (INDEPENDENT_AMBULATORY_CARE_PROVIDER_SITE_OTHER): Payer: Medicare Other | Admitting: Internal Medicine

## 2014-02-04 ENCOUNTER — Encounter (INDEPENDENT_AMBULATORY_CARE_PROVIDER_SITE_OTHER): Payer: Self-pay

## 2014-02-04 ENCOUNTER — Encounter: Payer: Self-pay | Admitting: Internal Medicine

## 2014-02-04 VITALS — BP 138/72 | HR 75 | Temp 97.8°F | Resp 16 | Ht 68.17 in | Wt 156.2 lb

## 2014-02-04 DIAGNOSIS — R7309 Other abnormal glucose: Secondary | ICD-10-CM

## 2014-02-04 DIAGNOSIS — N320 Bladder-neck obstruction: Secondary | ICD-10-CM

## 2014-02-04 DIAGNOSIS — E7800 Pure hypercholesterolemia, unspecified: Secondary | ICD-10-CM

## 2014-02-04 DIAGNOSIS — R739 Hyperglycemia, unspecified: Secondary | ICD-10-CM

## 2014-02-04 DIAGNOSIS — I10 Essential (primary) hypertension: Secondary | ICD-10-CM

## 2014-02-04 DIAGNOSIS — I2510 Atherosclerotic heart disease of native coronary artery without angina pectoris: Secondary | ICD-10-CM

## 2014-02-04 MED ORDER — LISINOPRIL 20 MG PO TABS: 20.0000 mg | ORAL_TABLET | Freq: Every day | ORAL | Status: AC

## 2014-02-04 NOTE — Progress Notes (Signed)
Pre-visit discussion using our clinic review tool. No additional management support is needed unless otherwise documented below in the visit note.  

## 2014-02-10 ENCOUNTER — Encounter: Payer: Self-pay | Admitting: Internal Medicine

## 2014-02-10 NOTE — Assessment & Plan Note (Signed)
Followed by Dr Achilles Dunkope.  Urinating well.  Follow.

## 2014-02-10 NOTE — Progress Notes (Signed)
  Subjective:    Patient ID: Jeff Reynolds, male    DOB: 10/10/1941, 73 y.o.   MRN: [id number]  HPI 73 year old male with past history of hypertension, hypercholesterolemia and CAD s/p CABG who comes in today for a scheduled follow up.  Still seeing Dr Achilles Dunkope.  Urinating well.  Is active.  Reports no chest pain or tightness with increased activity or exertion.  Breathing doing well.  No sob.  No acid reflux.  No abdominal pain.  Overdue colonoscopy.  Has declined GI w/up in the past.  Agreeable today.  Bowels stable.      Past Medical History  Diagnosis Date  . Hyperlipidemia   . Hypertension   . Hypercholesterolemia   . 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 Bladder outlet obstruction     followed by Dr Achilles Dunkope  . Nephrolithiasis   . History of elevated PSA     followed by Dr Achilles Dunkope    Current Outpatient Prescriptions on File Prior to Visit  Medication Sig Dispense Refill  . finasteride (PROSCAR) 5 MG tablet Take 5 mg by mouth daily.      . metoprolol (LOPRESSOR) 50 MG tablet Take 50 mg by mouth 2 (two) times daily. 1/2 tablet daily      . spironolactone (ALDACTONE) 25 MG tablet Take 25 mg by mouth daily.       No current facility-administered medications on file prior to visit.    Review of Systems Patient denies any headache, lightheadedness or dizziness. No sinus or allergy symptoms.  No chest pain, tightness or palpitations.  No increased shortness of breath, cough or congestion.  No acid reflux.  No nausea or vomiting.  No abdominal pain or cramping.  No bowel change, such as diarrhea, constipation, BRBPR or melana.  No urine change.        Objective:   Physical Exam  Filed Vitals:   02/04/14 1600  BP: 138/72  Pulse: 75  Temp: 97.8 F (36.6 C)  Resp: 16   Blood pressure recheck:  21136/8978  73 year old male in no acute distress.  HEENT:  Nares - clear.  Oropharynx - without lesions. NECK:  Supple.  Nontender.  No audible carotid bruit.  HEART:  Appears to be  regular.   LUNGS:  No crackles or wheezing audible.  Respirations even and unlabored.   RADIAL PULSE:  Equal bilaterally.  ABDOMEN:  Soft.  Nontender.  Bowel sounds present and normal.  No audible abdominal bruit.    EXTREMITIES:  No increased edema present.         Assessment & Plan:  PULMONARY.  No sob.  Breathing stable.  HEALTH MAINTENANCE.  Physical 03/01/12.  Prostate and PSAs followed through Dr Achilles Dunkope.  Agreeable to GI referral.  Overdue colonoscopy.  Refer to GI.  Schedule a physical.

## 2014-02-10 NOTE — Assessment & Plan Note (Signed)
S/p CABG.  Followed by Dr Gwen PoundsKowalski.  Currently asymptomatic.  Continue aggressive risk factor modification.

## 2014-02-10 NOTE — Assessment & Plan Note (Signed)
Off crestor.  Lipid profile 02/01/13 revealed total cholesterol 144, triglycerides 65, HDL 53 and LDL 78.  Overdue lipid check.  Schedule fasting labs.

## 2014-02-10 NOTE — Assessment & Plan Note (Signed)
Blood pressure controlled.  Same medication regimen.  Follow.

## 2014-02-10 NOTE — Assessment & Plan Note (Signed)
Recheck fasting glucose and a1c.

## 2014-02-15 ENCOUNTER — Other Ambulatory Visit (INDEPENDENT_AMBULATORY_CARE_PROVIDER_SITE_OTHER): Payer: Medicare Other

## 2014-02-15 DIAGNOSIS — R7309 Other abnormal glucose: Secondary | ICD-10-CM

## 2014-02-15 DIAGNOSIS — I10 Essential (primary) hypertension: Secondary | ICD-10-CM

## 2014-02-15 DIAGNOSIS — R739 Hyperglycemia, unspecified: Secondary | ICD-10-CM

## 2014-02-15 DIAGNOSIS — E7800 Pure hypercholesterolemia, unspecified: Secondary | ICD-10-CM

## 2014-02-15 DIAGNOSIS — N320 Bladder-neck obstruction: Secondary | ICD-10-CM

## 2014-02-15 LAB — MICROALBUMIN / CREATININE URINE RATIO
CREATININE, U: 218.8 mg/dL
MICROALB UR: 9.3 mg/dL — AB (ref 0.0–1.9)
MICROALB/CREAT RATIO: 4.3 mg/g (ref 0.0–30.0)

## 2014-02-15 LAB — CBC WITH DIFFERENTIAL/PLATELET
BASOS ABS: 0 10*3/uL (ref 0.0–0.1)
Basophils Relative: 0.4 % (ref 0.0–3.0)
EOS PCT: 1.3 % (ref 0.0–5.0)
Eosinophils Absolute: 0.1 10*3/uL (ref 0.0–0.7)
HEMATOCRIT: 47.2 % (ref 39.0–52.0)
HEMOGLOBIN: 15.8 g/dL (ref 13.0–17.0)
LYMPHS ABS: 1.8 10*3/uL (ref 0.7–4.0)
LYMPHS PCT: 29.2 % (ref 12.0–46.0)
MCHC: 33.4 g/dL (ref 30.0–36.0)
MCV: 91.2 fl (ref 78.0–100.0)
MONOS PCT: 8.4 % (ref 3.0–12.0)
Monocytes Absolute: 0.5 10*3/uL (ref 0.1–1.0)
Neutro Abs: 3.7 10*3/uL (ref 1.4–7.7)
Neutrophils Relative %: 60.7 % (ref 43.0–77.0)
PLATELETS: 179 10*3/uL (ref 150.0–400.0)
RBC: 5.18 Mil/uL (ref 4.22–5.81)
RDW: 13.8 % (ref 11.5–14.6)
WBC: 6.1 10*3/uL (ref 4.5–10.5)

## 2014-02-15 LAB — LIPID PANEL
CHOL/HDL RATIO: 4
Cholesterol: 178 mg/dL (ref 0–200)
HDL: 46.8 mg/dL (ref >39.00)
LDL Cholesterol: 119 mg/dL — ABNORMAL HIGH (ref 0–99)
Triglycerides: 63 mg/dL (ref 0.0–149.0)
VLDL: 12.6 mg/dL (ref 0.0–40.0)

## 2014-02-15 LAB — PSA, MEDICARE: PSA: 4.26 ng/mL — AB (ref 0.10–4.00)

## 2014-02-15 LAB — COMPREHENSIVE METABOLIC PANEL
ALBUMIN: 4 g/dL (ref 3.5–5.2)
ALT: 23 U/L (ref 0–53)
AST: 25 U/L (ref 0–37)
Alkaline Phosphatase: 52 U/L (ref 39–117)
BILIRUBIN TOTAL: 0.9 mg/dL (ref 0.3–1.2)
BUN: 26 mg/dL — ABNORMAL HIGH (ref 6–23)
CALCIUM: 9.4 mg/dL (ref 8.4–10.5)
CHLORIDE: 102 meq/L (ref 96–112)
CO2: 28 meq/L (ref 19–32)
Creatinine, Ser: 1 mg/dL (ref 0.4–1.5)
GFR: 77.18 mL/min (ref >60.00)
GLUCOSE: 108 mg/dL — AB (ref 70–99)
Potassium: 4 mEq/L (ref 3.5–5.1)
SODIUM: 137 meq/L (ref 135–145)
TOTAL PROTEIN: 6.4 g/dL (ref 6.0–8.3)

## 2014-02-15 LAB — TSH: TSH: 1.41 u[IU]/mL (ref 0.35–5.50)

## 2014-02-15 LAB — HEMOGLOBIN A1C: HEMOGLOBIN A1C: 5.1 % (ref 4.6–6.5)

## 2014-02-19 ENCOUNTER — Other Ambulatory Visit: Payer: Self-pay | Admitting: Internal Medicine

## 2014-02-19 DIAGNOSIS — E7800 Pure hypercholesterolemia, unspecified: Secondary | ICD-10-CM

## 2014-02-19 NOTE — Progress Notes (Signed)
Order placed for f/u liver panel.  

## 2014-02-20 ENCOUNTER — Other Ambulatory Visit: Payer: Self-pay | Admitting: *Deleted

## 2014-02-20 ENCOUNTER — Telehealth: Payer: Self-pay | Admitting: *Deleted

## 2014-02-20 DIAGNOSIS — Z1211 Encounter for screening for malignant neoplasm of colon: Secondary | ICD-10-CM

## 2014-02-20 MED ORDER — ROSUVASTATIN CALCIUM 5 MG PO TABS: 5.0000 mg | ORAL_TABLET | Freq: Every day | ORAL | Status: AC

## 2014-02-20 NOTE — Telephone Encounter (Signed)
When reporting labs, pt states he was to be referred for a colonoscopy and had not received a call yet. I don't see where a referral was placed?

## 2014-02-20 NOTE — Telephone Encounter (Signed)
Order placed for GI referral.   

## 2014-02-22 ENCOUNTER — Encounter: Payer: Self-pay | Admitting: Internal Medicine

## 2014-02-22 NOTE — Telephone Encounter (Signed)
Error

## 2014-03-12 ENCOUNTER — Telehealth: Payer: Self-pay | Admitting: Internal Medicine

## 2014-03-12 NOTE — Telephone Encounter (Signed)
Patient called in to let me know that we could proceed with his colonoscopy referral. He asked that I send you a message stating he couldn't afford some of his medications. He didn't go into much details just asked that you call him.

## 2014-03-12 NOTE — Telephone Encounter (Signed)
LMTCB for more information-needing to know which medications he could not afford

## 2014-03-14 ENCOUNTER — Emergency Department (HOSPITAL_COMMUNITY)
Admission: EM | Admit: 2014-03-14 | Discharge: 2014-03-25 | Disposition: E | Payer: Medicare Other | Attending: Emergency Medicine | Admitting: Emergency Medicine

## 2014-03-14 DIAGNOSIS — Z951 Presence of aortocoronary bypass graft: Secondary | ICD-10-CM | POA: Insufficient documentation

## 2014-03-14 DIAGNOSIS — I10 Essential (primary) hypertension: Secondary | ICD-10-CM | POA: Insufficient documentation

## 2014-03-14 DIAGNOSIS — I469 Cardiac arrest, cause unspecified: Secondary | ICD-10-CM | POA: Insufficient documentation

## 2014-03-14 DIAGNOSIS — E785 Hyperlipidemia, unspecified: Secondary | ICD-10-CM | POA: Insufficient documentation

## 2014-03-14 DIAGNOSIS — Z79899 Other long term (current) drug therapy: Secondary | ICD-10-CM | POA: Insufficient documentation

## 2014-03-14 DIAGNOSIS — Z880 Allergy status to penicillin: Secondary | ICD-10-CM | POA: Insufficient documentation

## 2014-03-14 DIAGNOSIS — E7800 Pure hypercholesterolemia, unspecified: Secondary | ICD-10-CM | POA: Insufficient documentation

## 2014-03-14 DIAGNOSIS — I2510 Atherosclerotic heart disease of native coronary artery without angina pectoris: Secondary | ICD-10-CM | POA: Insufficient documentation

## 2014-03-14 DIAGNOSIS — Z87448 Personal history of other diseases of urinary system: Secondary | ICD-10-CM | POA: Insufficient documentation

## 2014-03-14 DIAGNOSIS — Z87442 Personal history of urinary calculi: Secondary | ICD-10-CM | POA: Insufficient documentation

## 2014-03-14 HISTORY — DX: Atherosclerotic heart disease of native coronary artery without angina pectoris: I25.10

## 2014-03-14 HISTORY — DX: Disorder of prostate, unspecified: N42.9

## 2014-03-14 NOTE — Code Documentation (Signed)
EMS reports family on the way.

## 2014-03-14 NOTE — Code Documentation (Signed)
Patient time of death occurred at 2339.

## 2014-03-14 NOTE — Code Documentation (Signed)
Pt received 11 EPIs with EMS, calcium chloride, D50, and Narcan enroute. No response to any medications.

## 2014-03-14 NOTE — Code Documentation (Signed)
Patient was last seen alert at sun down. 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

## 2014-03-14 NOTE — Code Documentation (Signed)
EDP performing cardiac ultrasound.

## 2014-03-15 ENCOUNTER — Encounter (HOSPITAL_COMMUNITY): Payer: Self-pay | Admitting: Emergency Medicine

## 2014-03-15 ENCOUNTER — Encounter: Payer: Self-pay | Admitting: Internal Medicine

## 2014-03-15 NOTE — Progress Notes (Signed)
Chaplain was paged to support family of pt in trauma A who has passed. Pt was found unresponsive by EMS and CPR did not revive him. Brought family from ED waiting area to consult A and brought Dr. Jodi Mourning to inform them of pt's passing. Supported family emotionally as they processed their loss. Gave them privacy to make calls and then escorted them to C27 to say goodbyes to pt. Gathered Lexicographer for RN and gave pt's daughter the phone number for [employer] to call tomorrow once they have decided on a funeral home. Chaplain accompanied family out of ED. Pt's daughter thanked chaplain for his assistance.

## 2014-03-15 NOTE — Telephone Encounter (Signed)
Noted  

## 2014-03-15 NOTE — Code Documentation (Signed)
Organ procurement team notified. Bradly Bienenstock [phone]

## 2014-03-15 NOTE — Code Documentation (Signed)
Family updated as to patient's status. According to family, pt. Was found laying at the front door of an empty house.  Length of time down unknown.

## 2014-03-15 NOTE — Telephone Encounter (Signed)
Sympathy card mailed to family.  

## 2014-03-15 NOTE — Telephone Encounter (Signed)
Daughter, Sonny Dandy, called to inform pt had heart attack last night and passed away.

## 2014-03-15 NOTE — Code Documentation (Signed)
Patient time of death occurred at 2338/02/24

## 2014-03-15 NOTE — ED Notes (Signed)
Pt family concerned about where pt pants were. EMS was called and they left the pants and keys at the house. Pt did not arrive with pants nor keys.

## 2014-03-15 NOTE — Progress Notes (Signed)
RT called to room for CPR. Pt intubated prior to arrival. Pt bagged during CPR.

## 2014-03-21 NOTE — ED Provider Notes (Signed)
CSN: [id number]     Arrival date & time Mar 22, 2014  2336 History   First MD Initiated Contact with Patient 03/15/14 0008     No chief complaint on file.    (Consider location/radiation/quality/duration/timing/severity/associated sxs/prior Treatment) HPI Comments: 73 year old male with known CAD and high blood pressure presented with EMS after unwitnessed cardiac arrest. Patient was not seen for prolonged period time and was found down however body was warm CPR and EMS resuscitation was started. Patient asystole the whole time and brought in to the ER. Patient had no episodes of V. tach or V. fib. Unknown last normal time  The history is provided by the EMS personnel.    Past Medical History  Diagnosis Date  . Hyperlipidemia   . Hypercholesterolemia   . 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 Bladder outlet obstruction     followed by Dr Achilles Dunk  . Nephrolithiasis   . History of elevated PSA     followed by Dr Achilles Dunk  . Coronary artery disease   . Hypertension   . Prostate disease    Past Surgical History  Procedure Laterality Date  . Hernia repair  1985  . Nephrolithiasis    . Coronary artery bypass graft  12/01  . Coronary artery bypass graft      x 5   Family History  Problem Relation Age of Onset  . Diabetes Mother   . Hypertension Mother   . Heart disease Brother     x2   History  Substance Use Topics  . Smoking status: Unknown If Ever Smoked  . Smokeless tobacco: Current User    Types: Chew  . Alcohol Use: No    Review of Systems  Unable to perform ROS: Patient unresponsive      Allergies  Lipitor; Penicillin v potassium; Pravachol; and Zocor  Home Medications   Prior to Admission medications   Medication Sig Start Date End Date Taking? Authorizing Provider  finasteride (PROSCAR) 5 MG tablet Take 5 mg by mouth daily.    Historical Provider, MD  lisinopril (PRINIVIL,ZESTRIL) 20 MG tablet Take 1 tablet (20 mg total) by mouth daily. 02/04/14    Charm Barges, MD  metoprolol (LOPRESSOR) 50 MG tablet Take 50 mg by mouth 2 (two) times daily. 1/2 tablet daily    Historical Provider, MD  rosuvastatin (CRESTOR) 5 MG tablet Take 1 tablet (5 mg total) by mouth daily. 02/20/14   Charm Barges, MD  spironolactone (ALDACTONE) 25 MG tablet Take 25 mg by mouth daily.    Historical Provider, MD   BP   Pulse 0  Resp 0  Wt 180 lb (81.647 kg)  SpO2 0% Physical Exam  Nursing note and vitals reviewed. Constitutional: He appears well-developed.  HENT:  Head: Normocephalic and atraumatic.  Eyes:  The patient was unresponsive and equal bilateral pupils unresponsive to light  Neck: Neck supple.  Cardiovascular:  No pulses palpable  Pulmonary/Chest:  No spontaneous respirations equal breath sounds with bagging  Abdominal: Soft. He exhibits no distension.  Musculoskeletal: He exhibits no edema.  Neurological: GCS eye subscore is 1. GCS verbal subscore is 1. GCS motor subscore is 1.  Unresponsive to pain no spontaneous respiration  Skin:  Cool to touch no rigidity  Psychiatric:  Unresponsive    ED Course  Procedures (including critical care time)   EMERGENCY DEPARTMENT [place] CARDIAC EXAM "Study: Limited Ultrasound of the heart and pericardium"  INDICATIONS:Cardiac arrest Multiple views of the heart  and pericardium were obtained in real-time with a multi-frequency probe.  PERFORMED ZO:[id number]:Myself  IMAGES ARCHIVED?: Yes  FINDINGS: No pericardial effusion and No coordinated contractionis  LIMITATIONS:  Emergent procedure  VIEWS USED: Parasternal long axis and Parasternal short axis  INTERPRETATION: Cardiac activity absent and Pericardial effusioin absent    EMERGENCY DEPARTMENT US CARDIAC EXAM "Study: Limited Ultrasound of the heart and pericardium"  INDICATIONS:Cardiac arrest Multiple views of the heart and pericardium were obtained in real-time with a multi-frequency probe.  PERFORMED VW:[id number]:Myself  IMAGES ARCHIVED?:  Yes  FINDINGS: No pericardial effusion and No coordinated contractionis  LIMITATIONS:  Emergent procedure  VIEWS USED: Parasternal long axis and Parasternal short axis  INTERPRETATION: Cardiac activity absent and Pericardial effusioin absent   Cardiopulmonary Resuscitation (CPR) Procedure Note Directed/Performed by: Enid SkeensJoshua M Kamorie Aldous I personally directed ancillary staff and/or performed CPR in an effort to regain return of spontaneous circulation and to maintain cardiac, neuro and systemic perfusion.   Labs Review Labs Reviewed - No data to display  Imaging Review No results found.   EKG Interpretation None      MDM   Final diagnoses:  None   Patient received multiple epinephrine and CPR in the field. Patient with unknown downtime and asystole the entire time Arrival CPR continued and no pulse or cardiac motion by bedside ultrasound. Second round of CPR performed in no pulse or cardiac motion on repeat bedside ultrasound. With unknown downtime and no signs of life time of death called. I discussed this with family upon their arrival.  Cardiac arrest, CAD   Enid SkeensJoshua M Harlen Danford, MD 03/21/14 [phone]

## 2014-03-25 DEATH — deceased

## 2014-04-03 ENCOUNTER — Other Ambulatory Visit: Payer: Medicare Other

## 2014-04-10 IMAGING — US ULTRASOUND LEFT BREAST
1 series · 17 of 23 positions shown · non-contrast
Comparison: none

REASON FOR EXAM: LT BRST NODULE AND PAIN 12 TO 2 OCLOCK
COMMENTS:

[Series 1: ultrasound left breast · 17 of 23 slices shown]
[im 1/23]
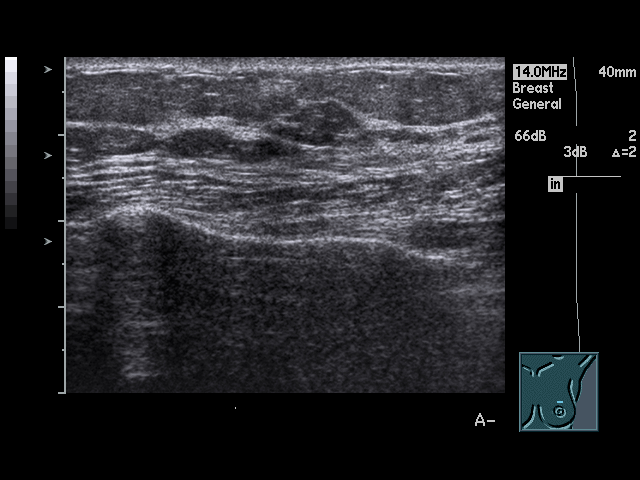
[im 3/23]
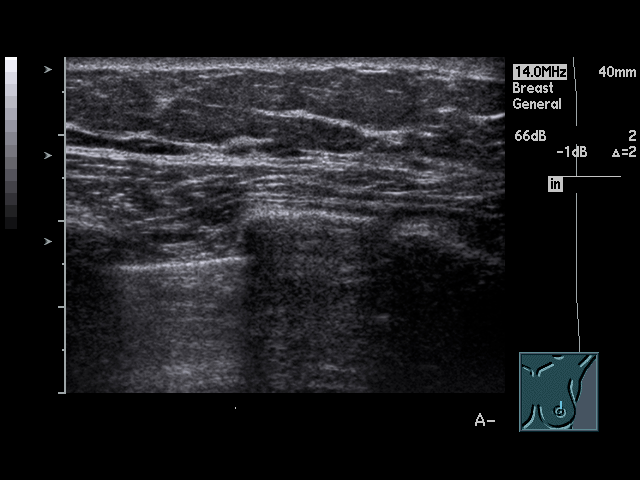
[im 4/23]
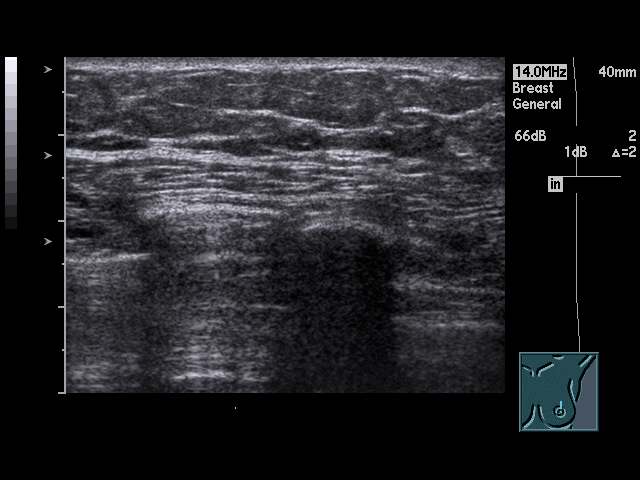
[im 5/23]
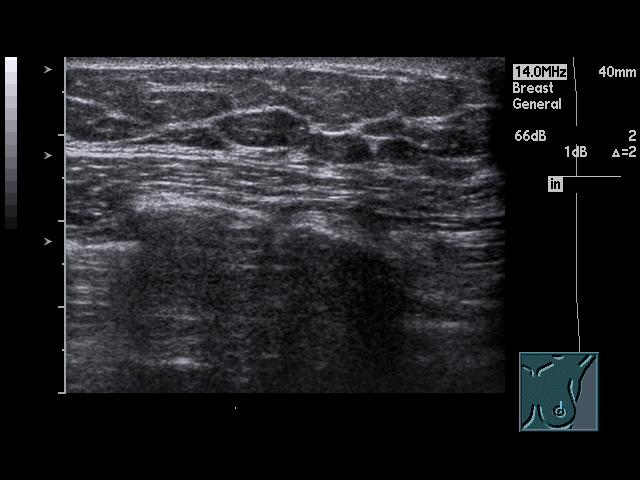
[im 7/23]
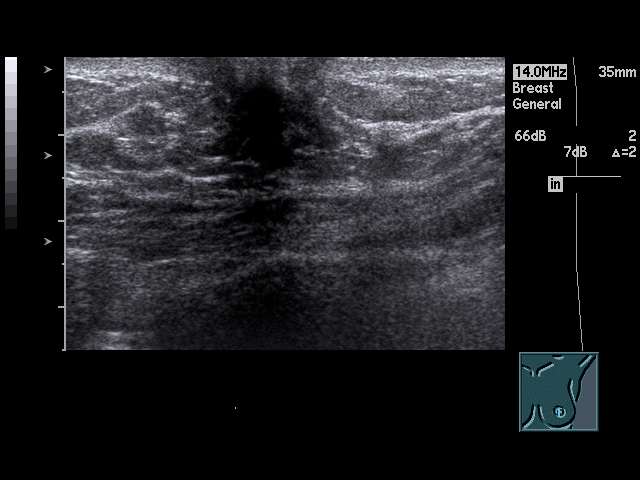
[im 8/23]
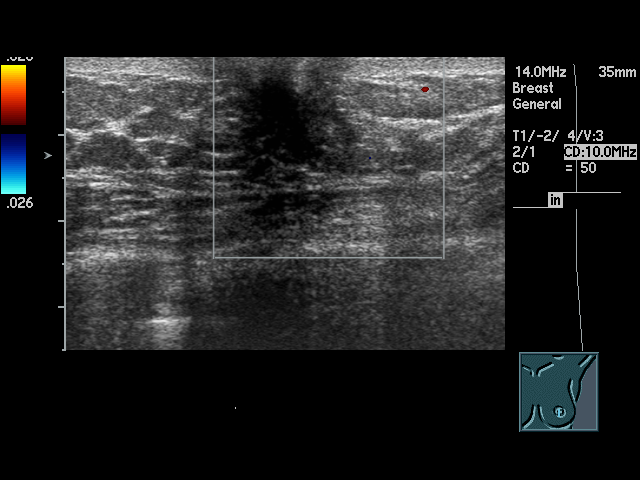
[im 9/23]
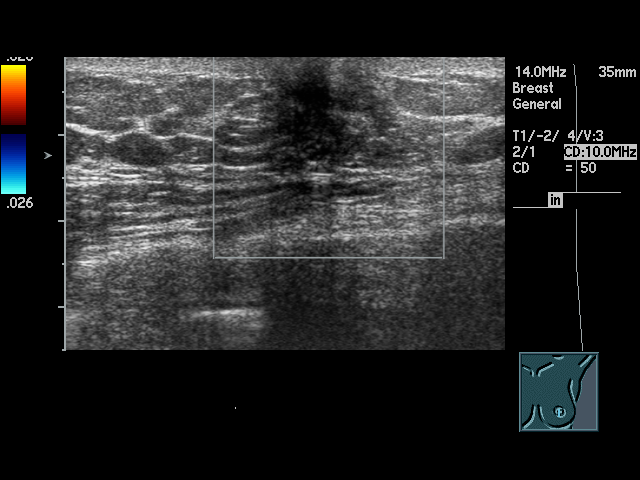
[im 11/23]
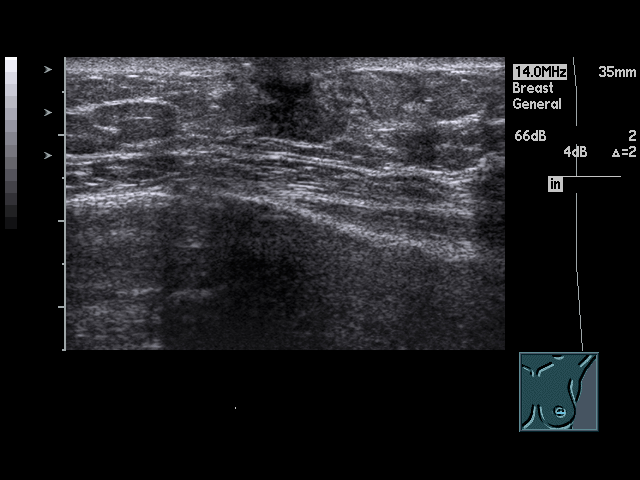
[im 12/23]
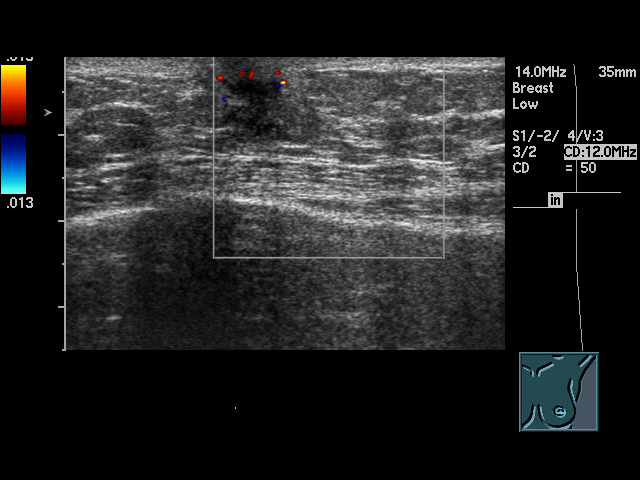
[im 13/23]
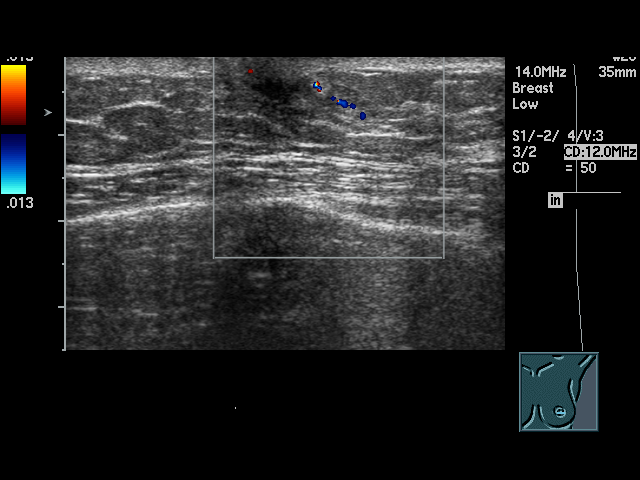
[im 15/23]
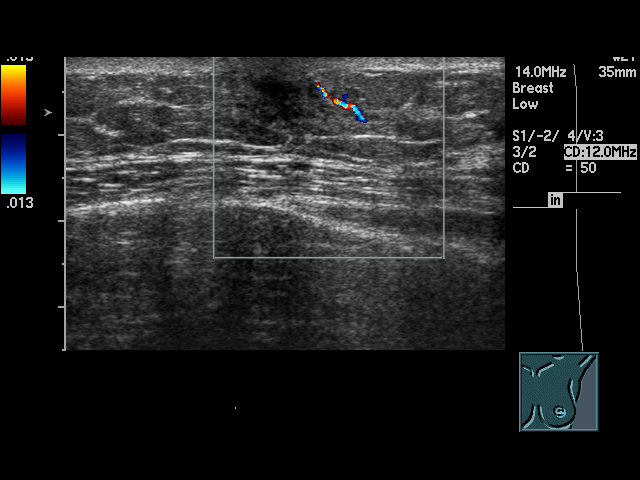
[im 16/23]
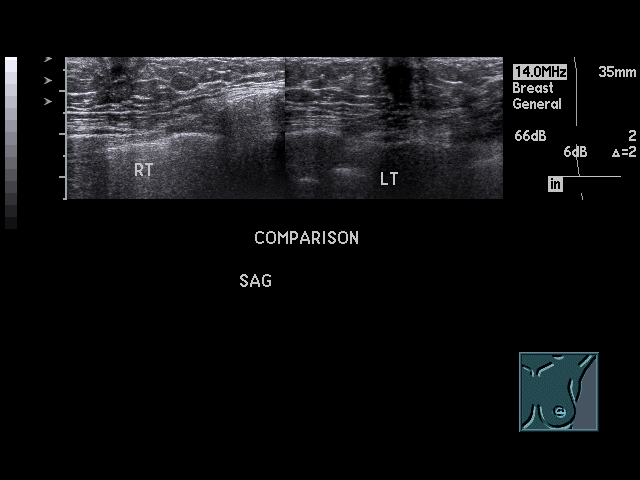
[im 17/23]
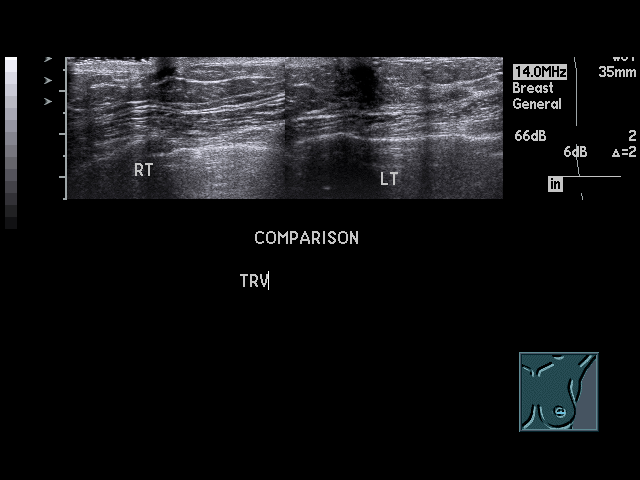
[im 19/23]
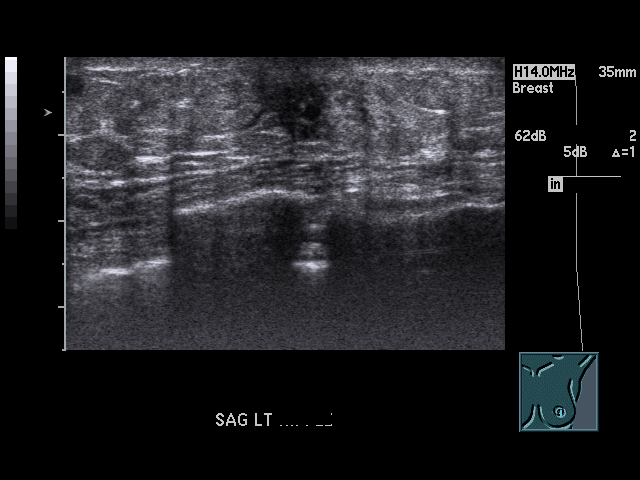
[im 20/23]
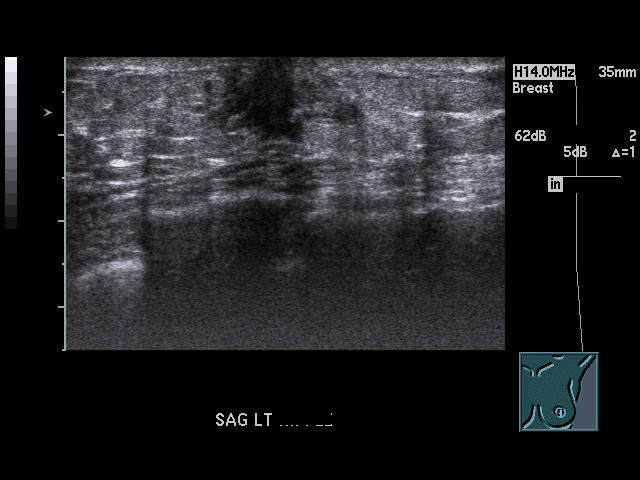
[im 21/23]
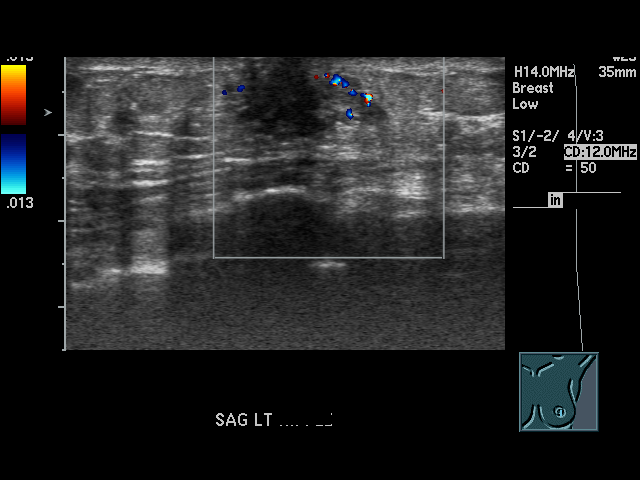
[im 23/23]
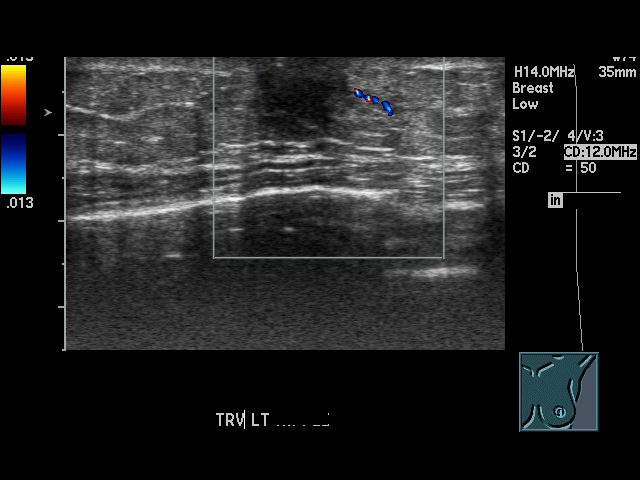

[17 of 23 positions shown; findings below may reference images not displayed]

PROCEDURE:     US  - US BREAST LEFT  - March 01, 2012 [DATE]

RESULT:     Left retroareolar parenchymal prominence is noted. No clear-cut
mass noted; however, given the mammographic findings and clinical findings,
a left retroareolar malignancy cannot be excluded and surgical consultation
is suggested.
IMPRESSION: Left retroareolar prominence. To exclude malignancy,
surgical consultation is suggested.

BI-RADS: Category 4 - Suspicious Abnormality

[REDACTED]

## 2014-06-04 ENCOUNTER — Encounter: Payer: Medicare Other | Admitting: Internal Medicine
